# Patient Record
Sex: Male | Born: 1995
Health system: Southern US, Community
[De-identification: ages and names within clinical notes are randomized; demographics above are authoritative.]

## PROBLEM LIST (undated history)

## (undated) DIAGNOSIS — Z8709 Personal history of other diseases of the respiratory system: Secondary | ICD-10-CM

## (undated) DIAGNOSIS — J302 Other seasonal allergic rhinitis: Secondary | ICD-10-CM

## (undated) HISTORY — DX: Other seasonal allergic rhinitis: J30.2

## (undated) HISTORY — DX: Personal history of other diseases of the respiratory system: Z87.09

---
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
## 2017-06-25 DIAGNOSIS — Z23 Encounter for immunization: Secondary | ICD-10-CM | POA: Diagnosis not present

## 2017-07-14 ENCOUNTER — Ambulatory Visit: Payer: Self-pay | Admitting: Family Medicine

## 2017-08-31 ENCOUNTER — Encounter: Payer: Self-pay | Admitting: Family Medicine

## 2017-08-31 ENCOUNTER — Ambulatory Visit (INDEPENDENT_AMBULATORY_CARE_PROVIDER_SITE_OTHER): Payer: 59 | Admitting: Family Medicine

## 2017-08-31 ENCOUNTER — Encounter (INDEPENDENT_AMBULATORY_CARE_PROVIDER_SITE_OTHER): Payer: Self-pay

## 2017-08-31 VITALS — BP 122/80 | HR 71 | Temp 98.4°F | Ht 68.5 in | Wt 217.0 lb

## 2017-08-31 DIAGNOSIS — J302 Other seasonal allergic rhinitis: Secondary | ICD-10-CM | POA: Insufficient documentation

## 2017-08-31 DIAGNOSIS — E669 Obesity, unspecified: Secondary | ICD-10-CM

## 2017-08-31 DIAGNOSIS — Z0000 Encounter for general adult medical examination without abnormal findings: Secondary | ICD-10-CM | POA: Insufficient documentation

## 2017-08-31 DIAGNOSIS — J301 Allergic rhinitis due to pollen: Secondary | ICD-10-CM | POA: Diagnosis not present

## 2017-08-31 NOTE — Patient Instructions (Addendum)
Recheck height.  You are doing well. Work on [employer] diet choices and regular exercise routine. Consider fasting labs next time to check cholesterol and sugar.  Return as needed or in 2-3 years for next physical.   Health Maintenance, Male A healthy lifestyle and preventive care is important for your health and wellness. Ask your health care provider about what schedule of regular examinations is right for you. What should I know about weight and diet? Eat a Healthy Diet  Eat plenty of vegetables, fruits, whole grains, low-fat dairy products, and lean protein.  Do not eat a lot of foods high in solid fats, added sugars, or salt.  Maintain a Healthy Weight Regular exercise can help you achieve or maintain a healthy weight. You should:  Do at least 150 minutes of exercise each week. The exercise should increase your heart rate and make you sweat (moderate-intensity exercise).  Do strength-training exercises at least twice a week.  Watch Your Levels of Cholesterol and Blood Lipids  Have your blood tested for lipids and cholesterol every 5 years starting at 19 years of age. If you are at high risk for heart disease, you should start having your blood tested when you are 19 years old. You may need to have your cholesterol levels checked more often if: ? Your lipid or cholesterol levels are high. ? You are older than 19 years of age. ? You are at high risk for heart disease.  What should I know about cancer screening? Many types of cancers can be detected early and may often be prevented. Lung Cancer  You should be screened every year for lung cancer if: ? You are a current smoker who has smoked for at least 30 years. ? You are a former smoker who has quit within the past 15 years.  Talk to your health care provider about your screening options, when you should start screening, and how often you should be screened.  Colorectal Cancer  Routine colorectal cancer screening usually  begins at 19 years of age and should be repeated every 5-10 years until you are 19 years old. You may need to be screened more often if early forms of precancerous polyps or small growths are found. Your health care provider may recommend screening at an earlier age if you have risk factors for colon cancer.  Your health care provider may recommend using home test kits to check for hidden blood in the stool.  A small camera at the end of a tube can be used to examine your colon (sigmoidoscopy or colonoscopy). This checks for the earliest forms of colorectal cancer.  Prostate and Testicular Cancer  Depending on your age and overall health, your health care provider may do certain tests to screen for prostate and testicular cancer.  Talk to your health care provider about any symptoms or concerns you have about testicular or prostate cancer.  Skin Cancer  Check your skin from head to toe regularly.  Tell your health care provider about any new moles or changes in moles, especially if: ? There is a change in a mole's size, shape, or color. ? You have a mole that is larger than a pencil eraser.  Always use sunscreen. Apply sunscreen liberally and repeat throughout the day.  Protect yourself by wearing long sleeves, pants, a wide-brimmed hat, and sunglasses when outside.  What should I know about heart disease, diabetes, and high blood pressure?  If you are 1818-19 years of age, have your blood pressure  checked every 3-5 years. If you are 67 years of age or older, have your blood pressure checked every year. You should have your blood pressure measured twice-once when you are at a hospital or clinic, and once when you are not at a hospital or clinic. Record the average of the two measurements. To check your blood pressure when you are not at a hospital or clinic, you can use: ? An automated blood pressure machine at a pharmacy. ? A home blood pressure monitor.  Talk to your health care  provider about your target blood pressure.  If you are between 83-48 years old, ask your health care provider if you should take aspirin to prevent heart disease.  Have regular diabetes screenings by checking your fasting blood sugar level. ? If you are at a normal weight and have a low risk for diabetes, have this test once every three years after the age of 5. ? If you are overweight and have a high risk for diabetes, consider being tested at a younger age or more often.  A one-time screening for abdominal aortic aneurysm (AAA) by ultrasound is recommended for men aged 88-75 years who are current or former smokers. What should I know about preventing infection? Hepatitis B If you have a higher risk for hepatitis B, you should be screened for this virus. Talk with your health care provider to find out if you are at risk for hepatitis B infection. Hepatitis C Blood testing is recommended for:  Everyone born from [birth year] through 1963.  Anyone with known risk factors for hepatitis C.  Sexually Transmitted Diseases (STDs)  You should be screened each year for STDs including gonorrhea and chlamydia if: ? You are sexually active and are younger than 19 years of age. ? You are older than 19 years of age and your health care provider tells you that you are at risk for this type of infection. ? Your sexual activity has changed since you were last screened and you are at an increased risk for chlamydia or gonorrhea. Ask your health care provider if you are at risk.  Talk with your health care provider about whether you are at high risk of being infected with HIV. Your health care provider may recommend a prescription medicine to help prevent HIV infection.  What else can I do?  Schedule regular health, dental, and eye exams.  Stay current with your vaccines (immunizations).  Do not use any tobacco products, such as cigarettes, chewing tobacco, and e-cigarettes. If you need help quitting, ask  your health care provider.  Limit alcohol intake to no more than 2 drinks per day. One drink equals 12 ounces of beer, 5 ounces of wine, or 1 ounces of hard liquor.  Do not use street drugs.  Do not share needles.  Ask your health care provider for help if you need support or information about quitting drugs.  Tell your health care provider if you often feel depressed.  Tell your health care provider if you have ever been abused or do not feel safe at home. This information is not intended to replace advice given to you by your health care provider. Make sure you discuss any questions you have with your health care provider. Document Released: 02/26/2008 Document Revised: 04/28/2016 Document Reviewed: 06/03/2015 Elsevier Interactive Patient Education  [employer].

## 2017-08-31 NOTE — Assessment & Plan Note (Signed)
Preventative protocols reviewed and updated unless pt declined. Discussed healthy diet and lifestyle.  

## 2017-08-31 NOTE — Assessment & Plan Note (Signed)
Reviewed healthy diet and lifestyle changes to affect sustainable weight loss.  

## 2017-08-31 NOTE — Progress Notes (Signed)
BP 122/80 (BP Location: Left Arm, Patient Position: Sitting, Cuff Size: Normal)   Pulse 71   Temp 98.4 F (36.9 C) (Oral)   Ht 5' 8.5" (1.74 m)   Wt 217 lb (98.4 kg)   SpO2 96%   BMI 32.51 kg/m    CC: new pt to establish Subjective:    Patient ID: Larry DoughtyWesley L Sotelo, male    DOB: 1996-07-15, 21 y.o.   MRN: [id number]  HPI: Larry DoughtyWesley L Stitely is a 21 y.o. male presenting on 08/31/2017 for New Patient (Initial Visit) (Wants to establish care. No concerns)   [facility] Records requested today.   Preventative: Flu shot yearly Tdap 2015 Seat belt use discussed  Sunscreen use discussed. No changing moles on skin.  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 Interim medical history since our last visit reviewed. Allergies and medications reviewed and updated. Outpatient Medications Prior to Visit  Medication Sig Dispense Refill  . cetirizine (ZYRTEC) 10 MG tablet Take 10 mg by mouth daily as needed for allergies.    Tery Sanfilippo. Docusate Calcium (STOOL SOFTENER PO) Take 1 tablet by mouth daily as needed.    . Multiple Vitamins-Minerals (MULTIVITAMIN ADULT PO) Take 1 tablet by mouth daily as needed.      No facility-administered medications prior to visit.      Per HPI unless specifically indicated in ROS section below Review of Systems  Constitutional: Negative for activity change, appetite change, chills, fatigue, fever and unexpected weight change.  HENT: Negative for hearing loss.   Eyes: Negative for visual disturbance.  Respiratory: Negative for cough, chest tightness, shortness of breath and wheezing.   Cardiovascular: Negative for chest pain,  palpitations and leg swelling.  Gastrointestinal: Negative for abdominal distention, abdominal pain, blood in stool, constipation, diarrhea, nausea and vomiting.  Genitourinary: Negative for difficulty urinating and hematuria.  Musculoskeletal: Negative for arthralgias, myalgias and neck pain.  Skin: Negative for rash.  Neurological: Negative for dizziness, seizures, syncope and headaches.  Hematological: Negative for adenopathy. Does not bruise/bleed easily.  Psychiatric/Behavioral: Negative for dysphoric mood. The patient is not nervous/anxious.        Objective:    BP 122/80 (BP Location: Left Arm, Patient Position: Sitting, Cuff Size: Normal)   Pulse 71   Temp 98.4 F (36.9 C) (Oral)   Ht 5' 8.5" (1.74 m)   Wt 217 lb (98.4 kg)   SpO2 96%   BMI 32.51 kg/m   Wt Readings from Last 3 Encounters:  08/31/17 217 lb (98.4 kg) (97 %, Z= 1.85)*   * Growth percentiles are based on CDC (Boys, 2-20 Years) data.    Physical Exam  Constitutional: He is oriented to person, place, and time. He appears well-developed and well-nourished. No distress.  HENT:  Head: Normocephalic and atraumatic.  Right Ear: Hearing, tympanic membrane, external ear and ear canal normal.  Left Ear: Hearing, tympanic membrane, external ear and ear canal normal.  Nose: Nose normal.  Mouth/Throat: Uvula is midline, oropharynx is clear and moist and mucous membranes are normal. No oropharyngeal exudate, posterior oropharyngeal edema or posterior oropharyngeal erythema.  Eyes: Conjunctivae and EOM are normal. Pupils are equal, round, and reactive to light. No scleral  icterus.  Neck: Normal range of motion. Neck supple. No thyromegaly present.  Cardiovascular: Normal rate, regular rhythm, normal heart sounds and intact distal pulses.  No murmur heard. Pulses:      Radial pulses are 2+ on the right side, and 2+ on the left side.  Pulmonary/Chest: Effort normal and breath sounds normal. No respiratory distress. He has  no wheezes. He has no rales.  Abdominal: Soft. Bowel sounds are normal. He exhibits no distension and no mass. There is no tenderness. There is no rebound and no guarding.  Musculoskeletal: Normal range of motion. He exhibits no edema.  Lymphadenopathy:    He has no cervical adenopathy.  Neurological: He is alert and oriented to person, place, and time.  CN grossly intact, station and gait intact  Skin: Skin is warm and dry. No rash noted.  Psychiatric: He has a normal mood and affect. His behavior is normal. Judgment and thought content normal.  Nursing note and vitals reviewed.  No results found for this or any previous visit.    Assessment & Plan:   Problem List Items Addressed This Visit    Health maintenance examination - Primary    Preventative protocols reviewed and updated unless pt declined. Discussed healthy diet and lifestyle.       Obesity, Class I, BMI 30.0-34.9 (see actual BMI)    Reviewed healthy diet and lifestyle changes to affect sustainable weight loss.       Seasonal allergic rhinitis       Follow up plan: Return in about 2 years (around 09/01/2019).  Eustaquio BoydenJavier Oree Hislop, MD

## 2017-09-01 ENCOUNTER — Other Ambulatory Visit: Payer: Self-pay | Admitting: Family Medicine

## 2017-09-01 ENCOUNTER — Other Ambulatory Visit (INDEPENDENT_AMBULATORY_CARE_PROVIDER_SITE_OTHER): Payer: 59

## 2017-09-01 DIAGNOSIS — E669 Obesity, unspecified: Secondary | ICD-10-CM

## 2017-09-01 LAB — COMPREHENSIVE METABOLIC PANEL
ALT: 75 U/L — ABNORMAL HIGH (ref 0–53)
AST: 43 U/L — ABNORMAL HIGH (ref 0–37)
Albumin: 4.7 g/dL (ref 3.5–5.2)
Alkaline Phosphatase: 48 U/L — ABNORMAL LOW (ref 52–171)
BILIRUBIN TOTAL: 0.5 mg/dL (ref 0.2–1.2)
BUN: 9 mg/dL (ref 6–23)
CALCIUM: 9.5 mg/dL (ref 8.4–10.5)
CHLORIDE: 102 meq/L (ref 96–112)
CO2: 30 meq/L (ref 19–32)
Creatinine, Ser: 0.78 mg/dL (ref 0.40–1.50)
GFR: 136.27 mL/min (ref >60.00)
GLUCOSE: 98 mg/dL (ref 70–99)
POTASSIUM: 4.3 meq/L (ref 3.5–5.1)
Sodium: 137 mEq/L (ref 135–145)
Total Protein: 7.6 g/dL (ref 6.0–8.3)

## 2017-09-01 LAB — LIPID PANEL
CHOL/HDL RATIO: 3
Cholesterol: 143 mg/dL (ref 0–200)
HDL: 54.2 mg/dL (ref >39.00)
LDL CALC: 70 mg/dL (ref 0–99)
NONHDL: 89.21
TRIGLYCERIDES: 96 mg/dL (ref 0.0–149.0)
VLDL: 19.2 mg/dL (ref 0.0–40.0)

## 2017-09-03 ENCOUNTER — Encounter: Payer: Self-pay | Admitting: Family Medicine

## 2017-09-03 ENCOUNTER — Other Ambulatory Visit: Payer: Self-pay | Admitting: Family Medicine

## 2017-09-03 DIAGNOSIS — R740 Nonspecific elevation of levels of transaminase and lactic acid dehydrogenase [LDH]: Principal | ICD-10-CM

## 2017-09-03 DIAGNOSIS — R7401 Elevation of levels of liver transaminase levels: Secondary | ICD-10-CM | POA: Insufficient documentation

## 2017-11-17 ENCOUNTER — Other Ambulatory Visit (INDEPENDENT_AMBULATORY_CARE_PROVIDER_SITE_OTHER): Payer: 59

## 2017-11-17 DIAGNOSIS — R740 Nonspecific elevation of levels of transaminase and lactic acid dehydrogenase [LDH]: Secondary | ICD-10-CM | POA: Diagnosis not present

## 2017-11-17 DIAGNOSIS — R7401 Elevation of levels of liver transaminase levels: Secondary | ICD-10-CM

## 2017-11-17 LAB — CBC WITH DIFFERENTIAL/PLATELET
BASOS PCT: 1.2 % (ref 0.0–3.0)
Basophils Absolute: 0.1 10*3/uL (ref 0.0–0.1)
EOS PCT: 4.6 % (ref 0.0–5.0)
Eosinophils Absolute: 0.3 10*3/uL (ref 0.0–0.7)
HEMATOCRIT: 46.6 % (ref 36.0–49.0)
Hemoglobin: 16 g/dL (ref 12.0–16.0)
LYMPHS PCT: 42.3 % (ref 24.0–48.0)
Lymphs Abs: 2.8 10*3/uL (ref 0.7–4.0)
MCHC: 34.4 g/dL (ref 31.0–37.0)
MCV: 88.9 fl (ref 78.0–98.0)
MONOS PCT: 9.9 % (ref 3.0–12.0)
Monocytes Absolute: 0.7 10*3/uL (ref 0.1–1.0)
Neutro Abs: 2.8 10*3/uL (ref 1.4–7.7)
Neutrophils Relative %: 42 % — ABNORMAL LOW (ref 43.0–71.0)
Platelets: 363 10*3/uL (ref 150.0–575.0)
RBC: 5.24 Mil/uL (ref 3.80–5.70)
RDW: 12.1 % (ref 11.4–15.5)
WBC: 6.6 10*3/uL (ref 4.5–13.5)

## 2017-11-17 LAB — HEPATIC FUNCTION PANEL
ALT: 56 U/L — AB (ref 0–53)
AST: 34 U/L (ref 0–37)
Albumin: 4.7 g/dL (ref 3.5–5.2)
Alkaline Phosphatase: 48 U/L — ABNORMAL LOW (ref 52–171)
Bilirubin, Direct: 0 mg/dL (ref 0.0–0.3)
TOTAL PROTEIN: 7.3 g/dL (ref 6.0–8.3)
Total Bilirubin: 0.3 mg/dL (ref 0.2–1.2)

## 2017-11-17 LAB — IBC PANEL
Iron: 80 ug/dL (ref 42–165)
Saturation Ratios: 19.5 % — ABNORMAL LOW (ref 20.0–50.0)
Transferrin: 293 mg/dL (ref 212.0–360.0)

## 2017-11-17 LAB — TSH: TSH: 2.07 u[IU]/mL (ref 0.40–5.00)

## 2018-06-26 DIAGNOSIS — Z23 Encounter for immunization: Secondary | ICD-10-CM | POA: Diagnosis not present
